# Patient Record
Sex: Female | Born: 1940 | Race: Black or African American | Hispanic: No | Smoking: Never smoker
Health system: Southern US, Community
[De-identification: ages and names within clinical notes are randomized; demographics above are authoritative.]

## PROBLEM LIST (undated history)

## (undated) DIAGNOSIS — I10 Essential (primary) hypertension: Secondary | ICD-10-CM

---

## 1973-10-28 HISTORY — PX: ABDOMINAL HYSTERECTOMY: SHX81

## 2014-01-22 ENCOUNTER — Emergency Department (HOSPITAL_COMMUNITY)
Admission: EM | Admit: 2014-01-22 | Discharge: 2014-01-22 | Disposition: A | Discharge status: Home / Self Care | Payer: Medicare Other | Attending: Emergency Medicine | Admitting: Emergency Medicine

## 2014-01-22 ENCOUNTER — Encounter (HOSPITAL_COMMUNITY): Payer: Self-pay | Admitting: Emergency Medicine

## 2014-01-22 ENCOUNTER — Emergency Department (HOSPITAL_COMMUNITY): Payer: Medicare Other

## 2014-01-22 DIAGNOSIS — Y929 Unspecified place or not applicable: Secondary | ICD-10-CM | POA: Insufficient documentation

## 2014-01-22 DIAGNOSIS — S0990XA Unspecified injury of head, initial encounter: Secondary | ICD-10-CM | POA: Insufficient documentation

## 2014-01-22 DIAGNOSIS — S8010XA Contusion of unspecified lower leg, initial encounter: Secondary | ICD-10-CM | POA: Insufficient documentation

## 2014-01-22 DIAGNOSIS — I10 Essential (primary) hypertension: Secondary | ICD-10-CM | POA: Insufficient documentation

## 2014-01-22 DIAGNOSIS — Y9301 Activity, walking, marching and hiking: Secondary | ICD-10-CM | POA: Insufficient documentation

## 2014-01-22 DIAGNOSIS — W108XXA Fall (on) (from) other stairs and steps, initial encounter: Secondary | ICD-10-CM | POA: Insufficient documentation

## 2014-01-22 DIAGNOSIS — W19XXXA Unspecified fall, initial encounter: Secondary | ICD-10-CM

## 2014-01-22 DIAGNOSIS — Z880 Allergy status to penicillin: Secondary | ICD-10-CM | POA: Insufficient documentation

## 2014-01-22 HISTORY — DX: Essential (primary) hypertension: I10

## 2014-01-22 LAB — URINALYSIS, ROUTINE W REFLEX MICROSCOPIC
Bilirubin Urine: NEGATIVE
GLUCOSE, UA: NEGATIVE mg/dL
HGB URINE DIPSTICK: NEGATIVE
Ketones, ur: NEGATIVE mg/dL
Leukocytes, UA: NEGATIVE
Nitrite: NEGATIVE
PH: 6.5 (ref 5.0–8.0)
Protein, ur: 30 mg/dL — AB
SPECIFIC GRAVITY, URINE: 1.013 (ref 1.005–1.030)
Urobilinogen, UA: 0.2 mg/dL (ref 0.0–1.0)

## 2014-01-22 LAB — BASIC METABOLIC PANEL
BUN: 21 mg/dL (ref 6–23)
CHLORIDE: 102 meq/L (ref 96–112)
CO2: 24 meq/L (ref 19–32)
Calcium: 9.7 mg/dL (ref 8.4–10.5)
Creatinine, Ser: 0.99 mg/dL (ref 0.50–1.10)
GFR calc Af Amer: 64 mL/min — ABNORMAL LOW (ref >90)
GFR calc non Af Amer: 56 mL/min — ABNORMAL LOW (ref >90)
GLUCOSE: 123 mg/dL — AB (ref 70–99)
POTASSIUM: 3.7 meq/L (ref 3.7–5.3)
Sodium: 140 mEq/L (ref 137–147)

## 2014-01-22 LAB — CBC WITH DIFFERENTIAL/PLATELET
Basophils Absolute: 0 10*3/uL (ref 0.0–0.1)
Basophils Relative: 1 % (ref 0–1)
Eosinophils Absolute: 0.1 10*3/uL (ref 0.0–0.7)
Eosinophils Relative: 2 % (ref 0–5)
HCT: 37.8 % (ref 36.0–46.0)
HEMOGLOBIN: 12.3 g/dL (ref 12.0–15.0)
LYMPHS ABS: 2 10*3/uL (ref 0.7–4.0)
LYMPHS PCT: 36 % (ref 12–46)
MCH: 28 pg (ref 26.0–34.0)
MCHC: 32.5 g/dL (ref 30.0–36.0)
MCV: 85.9 fL (ref 78.0–100.0)
MONOS PCT: 7 % (ref 3–12)
Monocytes Absolute: 0.4 10*3/uL (ref 0.1–1.0)
NEUTROS PCT: 56 % (ref 43–77)
Neutro Abs: 3.1 10*3/uL (ref 1.7–7.7)
Platelets: 267 10*3/uL (ref 150–400)
RBC: 4.4 MIL/uL (ref 3.87–5.11)
RDW: 13.5 % (ref 11.5–15.5)
WBC: 5.6 10*3/uL (ref 4.0–10.5)

## 2014-01-22 LAB — TROPONIN I: Troponin I: <0.3 ng/mL (ref <0.30)

## 2014-01-22 LAB — URINE MICROSCOPIC-ADD ON: URINE-OTHER: NONE SEEN

## 2014-01-22 NOTE — ED Provider Notes (Signed)
CSN: [id number]     Arrival date & time 01/22/14  1551 History   First MD Initiated Contact with Patient 01/22/14 1614     Chief Complaint  Patient presents with  . Fall     (Consider location/radiation/quality/duration/timing/severity/associated sxs/prior Treatment) HPI Comments: Patient presents to the ER for evaluation after a fall. She was walking down a set of 4 steps when she fell. She's not sure what caused the fall. She landed on her left side. She is complaining of moderate headache after the fall. There is no neck or back pain. She denies any chest pain, shortness of breath, or palpitations prior to the fall or afterwards. She has never had any seizures or syncopal episodes. Patient has a bruise on her left lower leg, but was ambulatory after the fall.  Patient is a 73 y.o. female presenting with fall.  Fall Associated symptoms include headaches.    Past Medical History  Diagnosis Date  . Hypertension    Past Surgical History  Procedure Laterality Date  . Abdominal hysterectomy  1975   No family history on file. History  Substance Use Topics  . Smoking status: Never Smoker   . Smokeless tobacco: Not on file  . Alcohol Use: No   OB History   Grav Para Term Preterm Abortions TAB SAB Ect Mult Living                 Review of Systems  Musculoskeletal: Negative for back pain and neck pain.  Neurological: Positive for headaches.  All other systems reviewed and are negative.      Allergies  Penicillins  Home Medications  No current outpatient prescriptions on file. BP 150/67  Pulse 66  Temp(Src) 98.2 F (36.8 C) (Oral)  Resp 19  SpO2 100% Physical Exam  Constitutional: She is oriented to person, place, and time. She appears well-developed and well-nourished. No distress.  HENT:  Head: Normocephalic and atraumatic.  Right Ear: Hearing normal.  Left Ear: Hearing normal.  Nose: Nose normal.  Mouth/Throat: Oropharynx is clear and moist and mucous  membranes are normal.  Eyes: Conjunctivae and EOM are normal. Pupils are equal, round, and reactive to light.  Neck: Normal range of motion. Neck supple.  Cardiovascular: Regular rhythm, S1 normal and S2 normal.  Exam reveals no gallop and no friction rub.   No murmur heard. Pulmonary/Chest: Effort normal and breath sounds normal. No respiratory distress. She exhibits no tenderness.  Abdominal: Soft. Normal appearance and bowel sounds are normal. There is no hepatosplenomegaly. There is no tenderness. There is no rebound, no guarding, no tenderness at McBurney's point and negative Murphy's sign. No hernia.  Musculoskeletal:       Right hip: Normal. She exhibits normal range of motion, normal strength and no deformity.       Left hip: Normal. She exhibits normal range of motion, normal strength and no deformity.       Left knee: Normal.       Left ankle: Normal.       Cervical back: Normal.       Thoracic back: Normal.       Lumbar back: Normal.       Legs: Neurological: She is alert and oriented to person, place, and time. She has normal strength. No cranial nerve deficit or sensory deficit. Coordination normal. GCS eye subscore is 4. GCS verbal subscore is 5. GCS motor subscore is 6.  Skin: Skin is warm, dry and intact. No rash noted. No cyanosis.  Psychiatric: She has a normal mood and affect. Her speech is normal and behavior is normal. Thought content normal.    ED Course  Procedures (including critical care time) Labs Review Labs Reviewed  BASIC METABOLIC PANEL - Abnormal; Notable for the following:    Glucose, Bld 123 (*)    GFR calc non Af Amer 56 (*)    GFR calc Af Amer 64 (*)    All other components within normal limits  URINALYSIS, ROUTINE W REFLEX MICROSCOPIC - Abnormal; Notable for the following:    Protein, ur 30 (*)    All other components within normal limits  CBC WITH DIFFERENTIAL  TROPONIN I  URINE MICROSCOPIC-ADD ON   Imaging Review Ct Head Wo  Contrast  01/22/2014   CLINICAL DATA:  Headache following trauma to the left side of the head.  EXAM: CT HEAD WITHOUT CONTRAST  TECHNIQUE: Contiguous axial images were obtained from the base of the skull through the vertex without intravenous contrast.  COMPARISON:  None.  FINDINGS: Patchy white matter low density in both cerebral hemispheres. Normal size and position of the ventricles. No skull fracture, intracranial hemorrhage or paranasal sinus air-fluid levels. Mild left maxillary sinus mucosal thickening with a maximum thickness of 3.1 mm.  IMPRESSION: 1. No acute abnormality. 2. 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 3. Minimal chronic left maxillary sinusitis.   Electronically Signed   By: Gordan PaymentSteve  Reid M.D.   On: 01/22/2014 17:28     EKG Interpretation None      MDM   Final diagnoses:  None    Patient presents to the ER for evaluation after a fall. She is not sure what caused the fall. She was walking down steps and then found herself on the floor at the bottom of the steps. It's not clear if there was any loss of consciousness. Because of this, workup was performed, including lab work and monitoring on telemetry. I cannot rule out syncope causing the fall, but simple mechanical fall with loss of consciousness could cause her to not remember what caused her to fall is present as well. She has done well here in the ER. Her only complaint was headache and CT scan was negative. She has mild bradycardia, but this has been persistently present in the past from reviewing records. There is no other arrhythmia noted.  The patient will be discharged with head injury precautions. Followup with primary care. Return for any chest pain, shortness of breath, passing out or increasing headache.    Gilda Creasehristopher J. Leeandre Nordling, MD 01/22/14 [phone]

## 2014-01-22 NOTE — Discharge Instructions (Signed)
Possible Syncope Syncope is a fainting spell. This means the person loses consciousness and drops to the ground. The person is generally unconscious for less than 5 minutes. The person may have some muscle twitches for up to 15 seconds before waking up and returning to normal. Syncope occurs more often in elderly people, but it can happen to anyone. While most causes of syncope are not dangerous, syncope can be a sign of a serious medical problem. It is important to seek medical care.  CAUSES  Syncope is caused by a sudden decrease in blood flow to the brain. The specific cause is often not determined. Factors that can trigger syncope include:  Taking medicines that lower blood pressure.  Sudden changes in posture, such as standing up suddenly.  Taking more medicine than prescribed.  Standing in one place for too long.  Seizure disorders.  Dehydration and excessive exposure to heat.  Low blood sugar (hypoglycemia).  Straining to have a bowel movement.  Heart disease, irregular heartbeat, or other circulatory problems.  Fear, emotional distress, seeing blood, or severe pain. SYMPTOMS  Right before fainting, you may:  Feel dizzy or lightheaded.  Feel nauseous.  See all white or all black in your field of vision.  Have cold, clammy skin. DIAGNOSIS  Your caregiver will ask about your symptoms, perform a physical exam, and perform electrocardiography (ECG) to record the electrical activity of your heart. Your caregiver may also perform other heart or blood tests to determine the cause of your syncope. TREATMENT  In most cases, no treatment is needed. Depending on the cause of your syncope, your caregiver may recommend changing or stopping some of your medicines. HOME CARE INSTRUCTIONS  Have someone stay with you until you feel stable.  Do not drive, operate machinery, or play sports until your caregiver says it is okay.  Keep all follow-up appointments as directed by your  caregiver.  Lie down right away if you start feeling like you might faint. Breathe deeply and steadily. Wait until all the symptoms have passed.  Drink enough fluids to keep your urine clear or pale yellow.  If you are taking blood pressure or heart medicine, get up slowly, taking several minutes to sit and then stand. This can reduce dizziness. SEEK IMMEDIATE MEDICAL CARE IF:   You have a severe headache.  You have unusual pain in the chest, abdomen, or back.  You are bleeding from the mouth or rectum, or you have black or tarry stool.  You have an irregular or very fast heartbeat.  You have pain with breathing.  You have repeated fainting or seizure-like jerking during an episode.  You faint when sitting or lying down.  You have confusion.  You have difficulty walking.  You have severe weakness.  You have vision problems. If you fainted, call your local emergency services (911 in [place] Do not drive yourself to the hospital.  MAKE SURE YOU:  Understand these instructions.  Will watch your condition.  Will get help right away if you are not doing well or get worse. Document Released: 10/14/2005 Document Revised: 04/14/2012 Document Reviewed: 12/13/2011 [facility] Patient Information 2014 [place], [place]. Head Injury, Adult You have received a head injury. It does not appear serious at this time. Headaches and vomiting are common following head injury. It should be easy to awaken from sleeping. Sometimes it is necessary for you to stay in the emergency department for a while for observation. Sometimes admission to the hospital may be needed. After injuries  such as yours, most problems occur within the first 24 hours, but side effects may occur up to 7 10 days after the injury. It is important for you to carefully monitor your condition and contact your health care provider or seek immediate medical care if there is a change in your condition. WHAT ARE THE TYPES OF HEAD  INJURIES? Head injuries can be as minor as a bump. Some head injuries can be more severe. More severe head injuries include:  A jarring injury to the brain (concussion).  A bruise of the brain (contusion). This mean there is bleeding in the brain that can cause swelling.  A cracked skull (skull fracture).  Bleeding in the brain that collects, clots, and forms a bump (hematoma). WHAT CAUSES A HEAD INJURY? A serious head injury is most likely to happen to someone who is in a car wreck and is not wearing a seat belt. Other causes of major head injuries include bicycle or motorcycle accidents, sports injuries, and falls. HOW ARE HEAD INJURIES DIAGNOSED? A complete history of the event leading to the injury and your current symptoms will be helpful in diagnosing head injuries. Many times, pictures of the brain, such as CT or MRI are needed to see the extent of the injury. Often, an overnight hospital stay is necessary for observation.  WHEN SHOULD I SEEK IMMEDIATE MEDICAL CARE?  You should get help right away if:  You have confusion or drowsiness.  You feel sick to your stomach (nauseous) or have continued, forceful vomiting.  You have dizziness or unsteadiness that is getting worse.  You have severe, continued headaches not relieved by medicine. Only take over-the-counter or prescription medicines for pain, fever, or discomfort as directed by your health care provider.  You do not have normal function of the arms or legs or are unable to walk.  You notice changes in the black spots in the center of the colored part of your eye (pupil).  You have a clear or bloody fluid coming from your nose or ears.  You have a loss of vision. During the next 24 hours after the injury, you must stay with someone who can watch you for the warning signs. This person should contact local emergency services (911 in the [place] if you have seizures, you become unconscious, or you are unable to wake up. HOW CAN  I PREVENT A HEAD INJURY IN THE FUTURE? The most important factor for preventing major head injuries is avoiding motor vehicle accidents. To minimize the potential for damage to your head, it is crucial to wear seat belts while riding in motor vehicles. Wearing helmets while bike riding and playing collision sports (like football) is also helpful. Also, avoiding dangerous activities around the house will further help reduce your risk of head injury.  WHEN CAN I RETURN TO NORMAL ACTIVITIES AND ATHLETICS? You should be reevaluated by your health care provider before returning to these activities. If you have any of the following symptoms, you should not return to activities or contact sports until 1 week after the symptoms have stopped:  Persistent headache.  Dizziness or vertigo.  Poor attention and concentration.  Confusion.  Memory problems.  Nausea or vomiting.  Fatigue or tire easily.  Irritability.  Intolerant of bright lights or loud noises.  Anxiety or depression.  Disturbed sleep. MAKE SURE YOU:   Understand these instructions.  Will watch your condition.  Will get help right away if you are not doing well or get worse. Document  Released: 10/14/2005 Document Revised: 08/04/2013 Document Reviewed: 06/21/2013 [facility] Patient Information 2014 [place], [place].

## 2014-01-22 NOTE — ED Notes (Addendum)
[facility] Pt walking off of stage and fell down three stairs resulting in swelling to left side of face and left leg pain.

## 2014-01-22 NOTE — ED Notes (Signed)
Bed: [id number] Expected date: 01/22/14 Expected time: 3:32 PM Means of arrival: Ambulance Comments: fall

## 2015-03-29 IMAGING — CT CT HEAD W/O CM
2 series · 17 of 30 positions shown, 20 images · non-contrast
Comparison: None.

CLINICAL DATA: Headache following trauma to the left side of the
head.

EXAM:
CT HEAD WITHOUT CONTRAST
TECHNIQUE: Contiguous axial images were obtained from the base of the skull
through the vertex without intravenous contrast.

[Series 2: head w/o · axial · non-contrast · 0.48mm/px · z∈[-135,-15]mm · 9 of 32 slices shown, 12 images]
[im 4/32  brain]
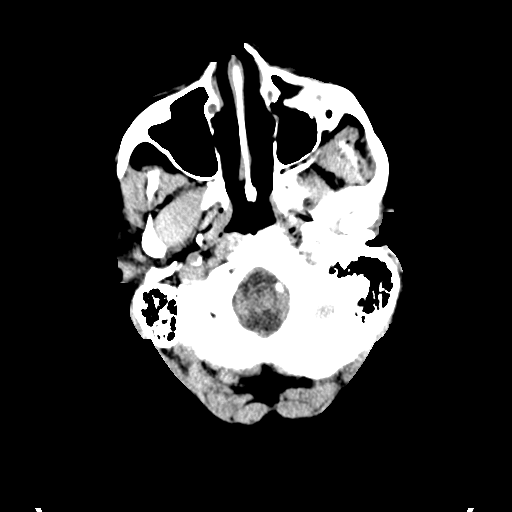
[im 4/32  bone]
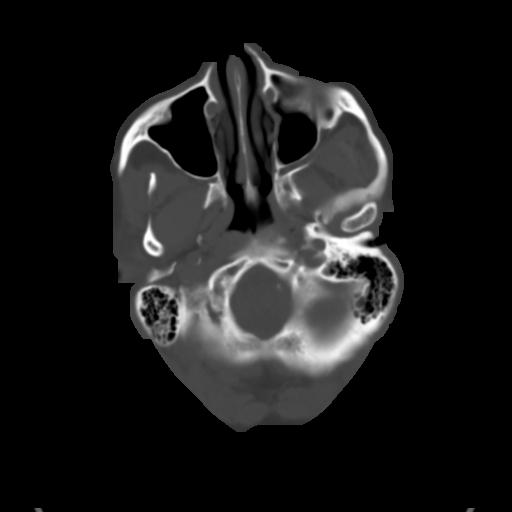
[im 7/32  brain]
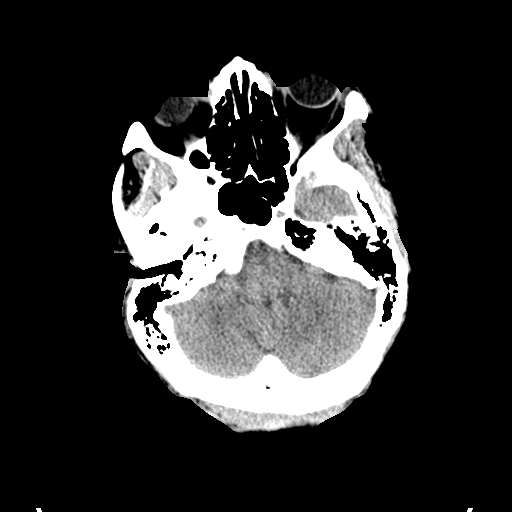
[im 10/32  brain]
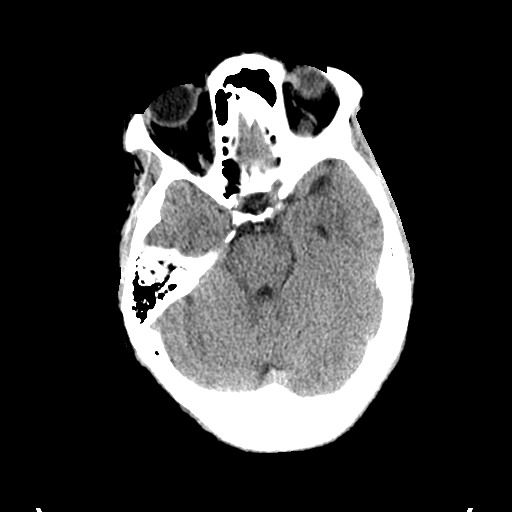
[im 13/32  brain]
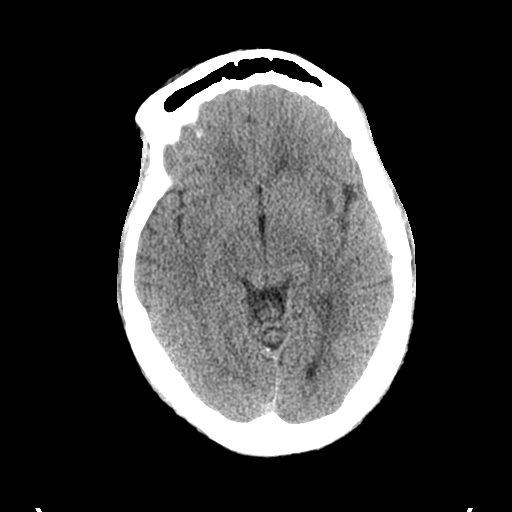
[im 16/32  brain]
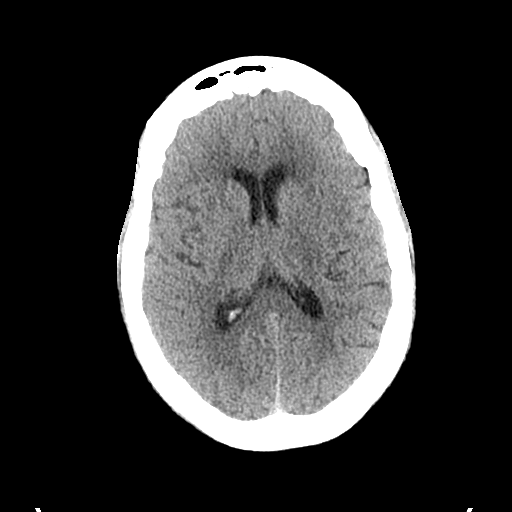
[im 16/32  bone]
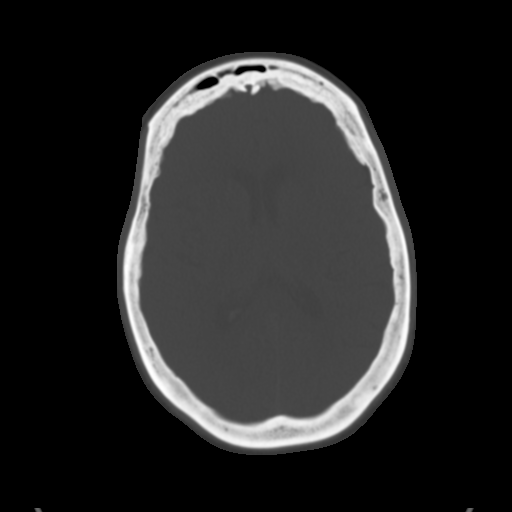
[im 19/32  brain]
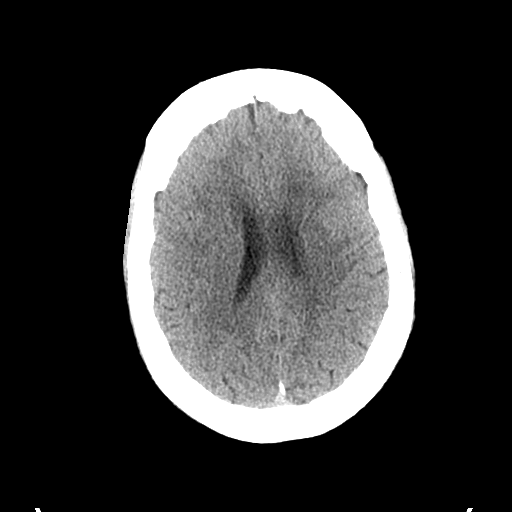
[im 22/32  brain]
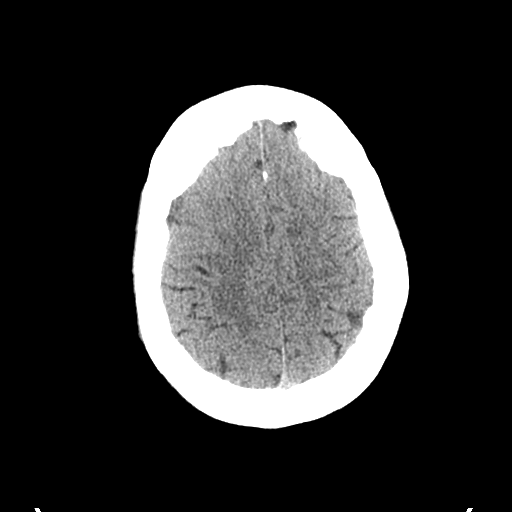
[im 25/32  brain]
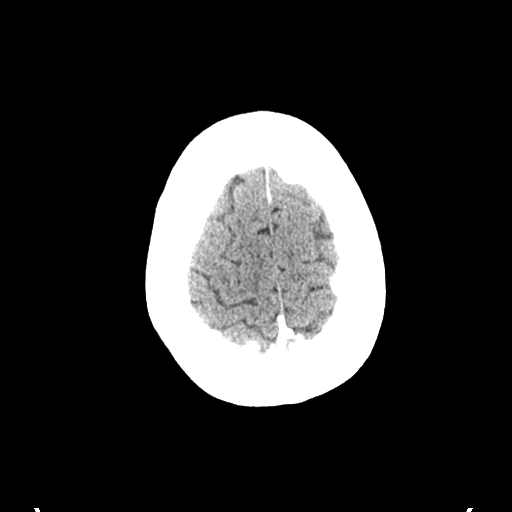
[im 28/32  brain]
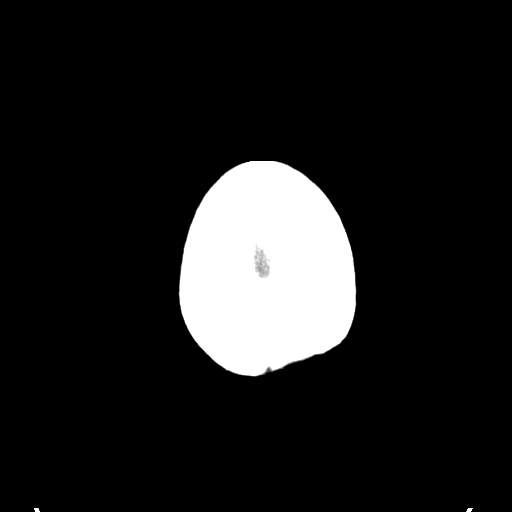
[im 28/32  bone]
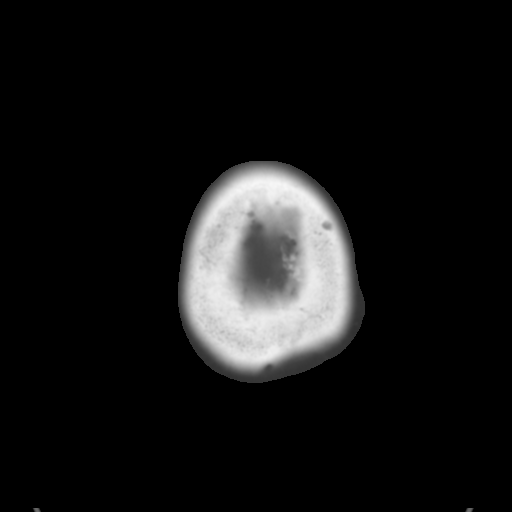

[Series 3: bone windows · axial · 0.48mm/px · z∈[-135,-12]mm · 8 of 53 slices shown]
[im 6/53  bone]
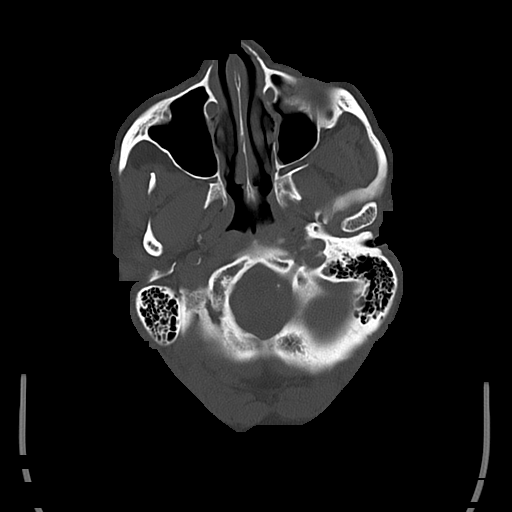
[im 12/53  bone]
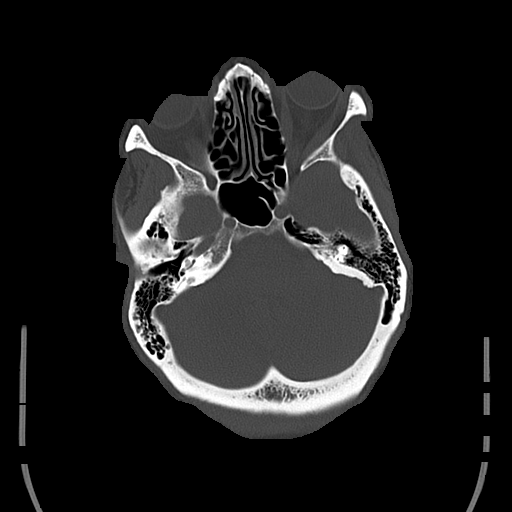
[im 18/53  bone]
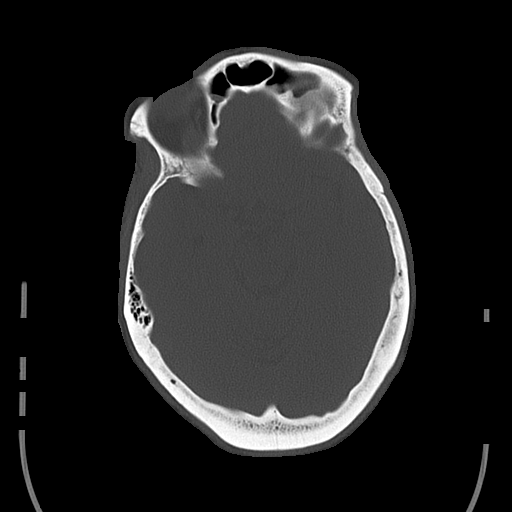
[im 24/53  bone]
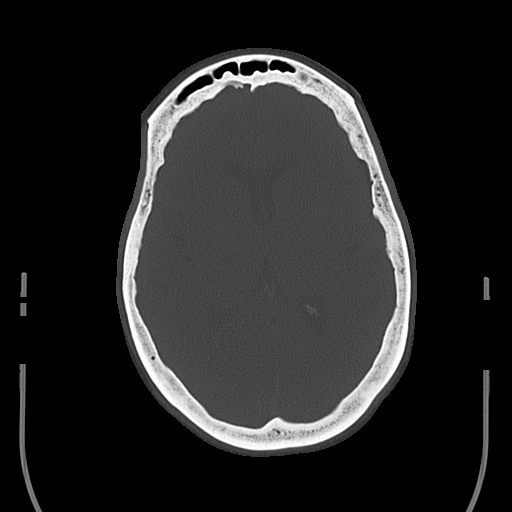
[im 29/53  bone]
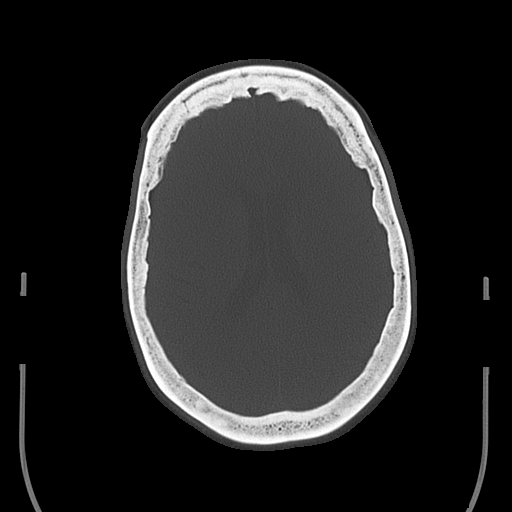
[im 35/53  bone]
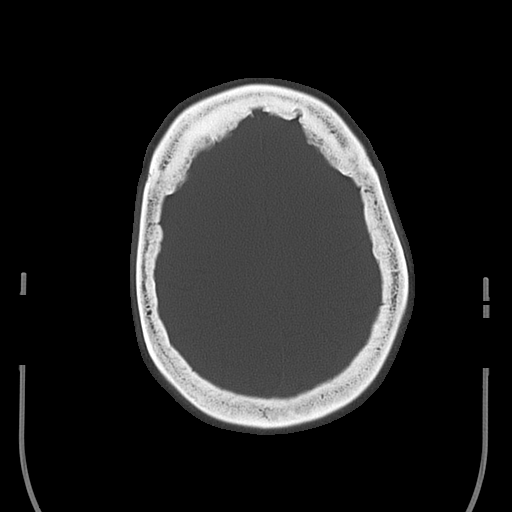
[im 41/53  bone]
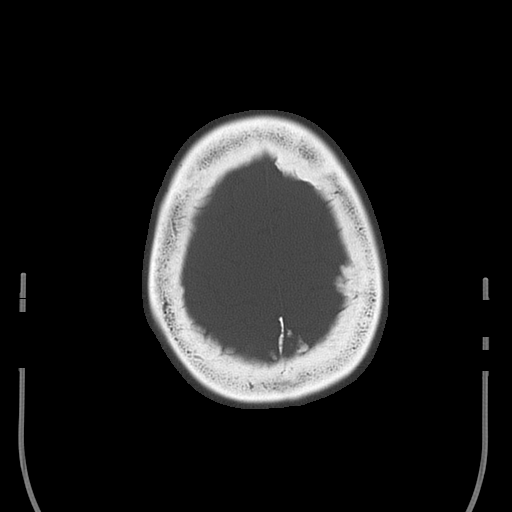
[im 47/53  bone]
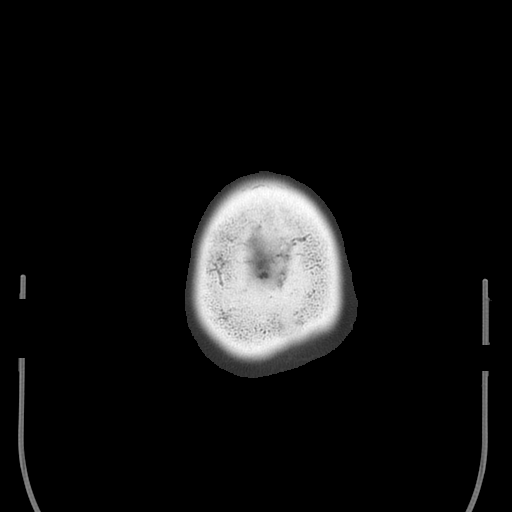

[17 of 30 positions shown; findings below may reference images not displayed]

FINDINGS: Patchy white matter low density in both cerebral hemispheres. Normal
size and position of the ventricles. No skull fracture, intracranial
hemorrhage or paranasal sinus air-fluid levels. Mild left maxillary
sinus mucosal thickening with a maximum thickness of 3.1 mm.
IMPRESSION: 1. No acute abnormality.
2. Mild to moderate chronic small vessel white matter ischemic
changes in both cerebral hemispheres.
3. Minimal chronic left maxillary sinusitis.
